# Patient Record
Sex: Female | Born: 2005
Health system: Southern US, Community
[De-identification: ages and names within clinical notes are randomized; demographics above are authoritative.]

## PROBLEM LIST (undated history)

## (undated) DIAGNOSIS — F419 Anxiety disorder, unspecified: Secondary | ICD-10-CM

## (undated) DIAGNOSIS — F32A Depression, unspecified: Secondary | ICD-10-CM

## (undated) DIAGNOSIS — T7840XA Allergy, unspecified, initial encounter: Secondary | ICD-10-CM

## (undated) DIAGNOSIS — N390 Urinary tract infection, site not specified: Secondary | ICD-10-CM

## (undated) DIAGNOSIS — F909 Attention-deficit hyperactivity disorder, unspecified type: Secondary | ICD-10-CM

## (undated) DIAGNOSIS — F509 Eating disorder, unspecified: Secondary | ICD-10-CM

---
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
## 2023-01-06 DIAGNOSIS — F411 Generalized anxiety disorder: Secondary | ICD-10-CM | POA: Diagnosis not present

## 2023-01-06 DIAGNOSIS — F640 Transsexualism: Secondary | ICD-10-CM | POA: Diagnosis not present

## 2023-01-20 DIAGNOSIS — F411 Generalized anxiety disorder: Secondary | ICD-10-CM | POA: Diagnosis not present

## 2023-01-20 DIAGNOSIS — R002 Palpitations: Secondary | ICD-10-CM | POA: Diagnosis not present

## 2023-01-20 DIAGNOSIS — R42 Dizziness and giddiness: Secondary | ICD-10-CM | POA: Diagnosis not present

## 2023-02-10 DIAGNOSIS — F900 Attention-deficit hyperactivity disorder, predominantly inattentive type: Secondary | ICD-10-CM | POA: Diagnosis not present

## 2023-02-10 DIAGNOSIS — F332 Major depressive disorder, recurrent severe without psychotic features: Secondary | ICD-10-CM | POA: Diagnosis not present

## 2023-02-10 DIAGNOSIS — F4310 Post-traumatic stress disorder, unspecified: Secondary | ICD-10-CM | POA: Diagnosis not present

## 2023-03-10 DIAGNOSIS — S60052A Contusion of left little finger without damage to nail, initial encounter: Secondary | ICD-10-CM | POA: Diagnosis not present

## 2023-03-28 DIAGNOSIS — F419 Anxiety disorder, unspecified: Secondary | ICD-10-CM | POA: Diagnosis not present

## 2023-03-28 DIAGNOSIS — R0789 Other chest pain: Secondary | ICD-10-CM | POA: Diagnosis not present

## 2023-03-28 DIAGNOSIS — R5383 Other fatigue: Secondary | ICD-10-CM | POA: Diagnosis not present

## 2023-03-28 DIAGNOSIS — Z1152 Encounter for screening for COVID-19: Secondary | ICD-10-CM | POA: Diagnosis not present

## 2023-03-28 DIAGNOSIS — I4510 Unspecified right bundle-branch block: Secondary | ICD-10-CM | POA: Diagnosis not present

## 2023-03-28 DIAGNOSIS — R55 Syncope and collapse: Secondary | ICD-10-CM | POA: Diagnosis not present

## 2023-03-28 DIAGNOSIS — R079 Chest pain, unspecified: Secondary | ICD-10-CM | POA: Diagnosis not present

## 2023-03-29 DIAGNOSIS — W19XXXA Unspecified fall, initial encounter: Secondary | ICD-10-CM | POA: Diagnosis not present

## 2023-03-29 DIAGNOSIS — R55 Syncope and collapse: Secondary | ICD-10-CM | POA: Diagnosis not present

## 2023-03-29 DIAGNOSIS — S0990XA Unspecified injury of head, initial encounter: Secondary | ICD-10-CM | POA: Diagnosis not present

## 2023-03-29 DIAGNOSIS — R110 Nausea: Secondary | ICD-10-CM | POA: Diagnosis not present

## 2023-03-29 DIAGNOSIS — R569 Unspecified convulsions: Secondary | ICD-10-CM | POA: Diagnosis not present

## 2023-03-29 DIAGNOSIS — Y92219 Unspecified school as the place of occurrence of the external cause: Secondary | ICD-10-CM | POA: Diagnosis not present

## 2023-03-30 DIAGNOSIS — R000 Tachycardia, unspecified: Secondary | ICD-10-CM | POA: Diagnosis not present

## 2023-03-30 DIAGNOSIS — R4589 Other symptoms and signs involving emotional state: Secondary | ICD-10-CM | POA: Diagnosis not present

## 2023-03-30 DIAGNOSIS — R55 Syncope and collapse: Secondary | ICD-10-CM | POA: Diagnosis not present

## 2023-03-30 DIAGNOSIS — I4519 Other right bundle-branch block: Secondary | ICD-10-CM | POA: Diagnosis not present

## 2023-04-01 DIAGNOSIS — F419 Anxiety disorder, unspecified: Secondary | ICD-10-CM | POA: Diagnosis not present

## 2023-04-01 DIAGNOSIS — F32A Depression, unspecified: Secondary | ICD-10-CM | POA: Diagnosis not present

## 2023-04-01 DIAGNOSIS — R000 Tachycardia, unspecified: Secondary | ICD-10-CM | POA: Diagnosis not present

## 2023-04-01 DIAGNOSIS — Z79899 Other long term (current) drug therapy: Secondary | ICD-10-CM | POA: Diagnosis not present

## 2023-04-01 DIAGNOSIS — R42 Dizziness and giddiness: Secondary | ICD-10-CM | POA: Diagnosis not present

## 2023-04-01 DIAGNOSIS — R0602 Shortness of breath: Secondary | ICD-10-CM | POA: Diagnosis not present

## 2023-04-01 DIAGNOSIS — Z79890 Hormone replacement therapy (postmenopausal): Secondary | ICD-10-CM | POA: Diagnosis not present

## 2023-04-01 DIAGNOSIS — F909 Attention-deficit hyperactivity disorder, unspecified type: Secondary | ICD-10-CM | POA: Diagnosis not present

## 2023-04-05 DIAGNOSIS — R55 Syncope and collapse: Secondary | ICD-10-CM | POA: Diagnosis not present

## 2023-04-05 DIAGNOSIS — R000 Tachycardia, unspecified: Secondary | ICD-10-CM | POA: Diagnosis not present

## 2023-04-07 DIAGNOSIS — F900 Attention-deficit hyperactivity disorder, predominantly inattentive type: Secondary | ICD-10-CM | POA: Diagnosis not present

## 2023-04-07 DIAGNOSIS — F4310 Post-traumatic stress disorder, unspecified: Secondary | ICD-10-CM | POA: Diagnosis not present

## 2023-04-07 DIAGNOSIS — F332 Major depressive disorder, recurrent severe without psychotic features: Secondary | ICD-10-CM | POA: Diagnosis not present

## 2023-04-19 DIAGNOSIS — R55 Syncope and collapse: Secondary | ICD-10-CM | POA: Diagnosis not present

## 2023-04-19 DIAGNOSIS — R000 Tachycardia, unspecified: Secondary | ICD-10-CM | POA: Diagnosis not present

## 2023-04-20 DIAGNOSIS — R000 Tachycardia, unspecified: Secondary | ICD-10-CM | POA: Diagnosis not present

## 2023-04-20 DIAGNOSIS — R55 Syncope and collapse: Secondary | ICD-10-CM | POA: Diagnosis not present

## 2023-05-04 DIAGNOSIS — F332 Major depressive disorder, recurrent severe without psychotic features: Secondary | ICD-10-CM | POA: Diagnosis not present

## 2023-05-04 DIAGNOSIS — F4310 Post-traumatic stress disorder, unspecified: Secondary | ICD-10-CM | POA: Diagnosis not present

## 2023-05-04 DIAGNOSIS — F900 Attention-deficit hyperactivity disorder, predominantly inattentive type: Secondary | ICD-10-CM | POA: Diagnosis not present

## 2023-05-25 DIAGNOSIS — F649 Gender identity disorder, unspecified: Secondary | ICD-10-CM | POA: Diagnosis not present

## 2023-05-27 DIAGNOSIS — D485 Neoplasm of uncertain behavior of skin: Secondary | ICD-10-CM | POA: Diagnosis not present

## 2023-05-27 DIAGNOSIS — D2271 Melanocytic nevi of right lower limb, including hip: Secondary | ICD-10-CM | POA: Diagnosis not present

## 2023-06-01 ENCOUNTER — Encounter: Payer: Self-pay | Admitting: Cardiovascular Disease

## 2023-06-01 ENCOUNTER — Telehealth: Payer: Self-pay | Admitting: Pediatrics

## 2023-06-01 ENCOUNTER — Ambulatory Visit: Payer: BC Managed Care – PPO | Attending: Cardiovascular Disease | Admitting: Cardiovascular Disease

## 2023-06-01 VITALS — BP 118/72 | HR 78 | Ht 66.0 in | Wt 128.6 lb

## 2023-06-01 DIAGNOSIS — R55 Syncope and collapse: Secondary | ICD-10-CM | POA: Insufficient documentation

## 2023-06-01 NOTE — Patient Instructions (Signed)
Medication Instructions:  Your physician recommends that you continue on your current medications as directed. Please refer to the Current Medication list given to you today.  *If you need a refill on your cardiac medications before your next appointment, please call your pharmacy*   Follow-Up: At [facility], you and your health needs are our priority.  As part of our continuing mission to provide you with exceptional heart care, we have created designated Provider Care Teams.  These Care Teams include your primary Cardiologist (physician) and Advanced Practice Providers (APPs -  Physician Assistants and Nurse Practitioners) who all work together to provide you with the care you need, when you need it.  We recommend signing up for the patient portal called "MyChart".  Sign up information is provided on this After Visit Summary.  MyChart is used to connect with patients for Virtual Visits (Telemedicine).  Patients are able to view lab/test results, encounter notes, upcoming appointments, etc.  Non-urgent messages can be sent to your provider as well.   To learn more about what you can do with MyChart, go to [email or web address].    Your next appointment:   We will see you on an as needed basis.  Provider:   Jonathan Berry, MD  

## 2023-06-01 NOTE — Assessment & Plan Note (Signed)
Madison [name] is see me with his dad Latrell Reitan as referral for evaluation of tachycardia and syncope.  He has seen a cardiologist in [facility], Dr. Virgilio Belling, was thorough workup including a 2D echo which was essentially normal with a Zio patch which showed no significant arrhythmias other than some atrial tachycardia.  He describes palpitations for last 6 or 8 years.  Since being in college he is going to the ER 7 times for lightheadedness, palpitations and syncope.  He also has ADHD and anxiety depression on SSRI and trazodone.  I am going to refer him to Dr. Berton Mount, electrophysiology for further evaluation.

## 2023-06-01 NOTE — Telephone Encounter (Signed)
Patient would like to establish care with Dr. Linford Arnold. The patients family members are current patients of Dr. Linford Arnold.

## 2023-06-01 NOTE — Progress Notes (Signed)
Madison [name]     06/01/2023 Madison [name]   2006/01/22  [id number]  Primary Physician Madison Peach, April, MD Primary Cardiologist: Madison Gess MD Madison [name], Madison [name]  HPI:  Madison [name] is a 17 y.o. thin-appearing transgender female who is accompanied by his father Madison [name], who is a patient of Madison [name]'s.  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  He does have a history of anxiety depression and ADHD as well as OCD.  He is on SSRI and trazodone and was on ADHD medications which have been stopped.  He describes palpitations for at least 6 to 8 years.  Since being at college he has visited the ER 7 times being evaluated for lightheadedness, tachypalpitations and syncope.  He did see a cardiologist in [facility], Madison [name], who did a 2D echo which was essentially normal and Zio patch which showed no significant arrhythmias.  He does have an incomplete right bundle branch block.   Current Meds  Medication Sig   FLUoxetine (PROZAC) 40 MG capsule Take 40 mg by mouth 2 (two) times daily.   Testosterone 12.5 MG/ACT (1%) GEL Place onto the skin.   traZODone (DESYREL) 50 MG tablet Take 25 mg by mouth at bedtime. Take 0.5 tablets 4 times daily     Allergies  Allergen Reactions   Cats Claw (Uncaria Tomentosa)     Sinus infection   Grass Pollen(K-O-R-T-Swt Vern)     unknown    Social History   Socioeconomic History   Marital status: Single    Spouse name: Not on file   Number of children: Not on file   Years of education: Not on file   Highest education level: Not on file  Occupational History   Occupation: Lobbyist: [employer]  Tobacco Use   Smoking status: Never   Smokeless tobacco: Never  Substance and Sexual Activity   Alcohol use: Yes    Comment: one time occurance    Drug use: Never   Sexual activity: Not Currently    Birth control/protection: None  Other Topics Concern    Not on file  Social History Narrative   Not on file   Social Determinants of Health   Financial Resource Strain: Not on file  Food Insecurity: Not on file  Transportation Needs: Not on file  Physical Activity: Not on file  Stress: Not on file  Social Connections: Not on file  Intimate Partner Violence: Not on file     Review of Systems: General: negative for chills, fever, night sweats or weight changes.  Cardiovascular: negative for chest pain, dyspnea on exertion, edema, orthopnea, palpitations, paroxysmal nocturnal dyspnea or shortness of breath Dermatological: negative for rash Respiratory: negative for cough or wheezing Urologic: negative for hematuria Abdominal: negative for nausea, vomiting, diarrhea, bright red blood per rectum, melena, or hematemesis Neurologic: negative for visual changes, syncope, or dizziness All other systems reviewed and are otherwise negative except as noted above.    Blood pressure 118/72, pulse 78, height 5\' 6"  (1.676 m), weight 128 lb 9.6 oz (58.3 kg), SpO2 98 %.  General appearance: alert and no distress Neck: no adenopathy, no carotid bruit, no JVD, supple, symmetrical, trachea midline, and thyroid not enlarged, symmetric, no tenderness/mass/nodules Lungs: clear to auscultation bilaterally Heart: regular rate and rhythm, S1, S2 normal, no murmur, click, rub or gallop Extremities: extremities normal, atraumatic, no cyanosis or edema Pulses: 2+ and  symmetric Skin: Skin color, texture, turgor normal. No rashes or lesions Neurologic: Grossly normal  EKG sinus rhythm at 78 with incomplete right bundle branch block.  I personally reviewed this EKG.  ASSESSMENT AND PLAN:   Neurocardiogenic syncope Madison [name] is see me with his dad Madison [name] as referral for evaluation of tachycardia and syncope.  He has seen a cardiologist in [facility], Madison [name], was thorough workup including a 2D echo which was essentially normal with a Zio  patch which showed no significant arrhythmias other than some atrial tachycardia.  He describes palpitations for last 6 or 8 years.  Since being in college he is going to the ER 7 times for lightheadedness, palpitations and syncope.  He also has ADHD and anxiety depression on SSRI and trazodone.  I am going to refer him to Dr. Berton [name], electrophysiology for further evaluation.     Madison Gess MD FACP,FACC,FAHA, [facility] 06/01/2023 2:21 PM

## 2023-06-07 NOTE — Telephone Encounter (Signed)
Yes ok to schedule.

## 2023-06-10 NOTE — Telephone Encounter (Signed)
Patient scheduled for 07/21/23

## 2023-06-21 ENCOUNTER — Encounter: Payer: Self-pay | Admitting: Internal Medicine

## 2023-06-21 ENCOUNTER — Ambulatory Visit: Payer: BC Managed Care – PPO | Attending: Internal Medicine | Admitting: Internal Medicine

## 2023-06-21 VITALS — BP 107/68 | HR 100 | Ht 66.0 in | Wt 129.8 lb

## 2023-06-21 DIAGNOSIS — R55 Syncope and collapse: Secondary | ICD-10-CM

## 2023-06-21 DIAGNOSIS — Q211 Atrial septal defect, unspecified: Secondary | ICD-10-CM | POA: Diagnosis not present

## 2023-06-21 NOTE — Patient Instructions (Addendum)
Medication Instructions:  Your physician recommends that you continue on your current medications as directed. Please refer to the Current Medication list given to you today.  *If you need a refill on your cardiac medications before your next appointment, please call your pharmacy*   Lab Work: None ordered.  If you have labs (blood work) drawn today and your tests are completely normal, you will receive your results only by: MyChart Message (if you have MyChart) OR A paper copy in the mail If you have any lab test that is abnormal or we need to change your treatment, we will call you to review the results.   Testing/Procedures: Echo with Bubble study - you will be called to schedule this appointment    Follow-Up: At [facility], you and your health needs are our priority.  As part of our continuing mission to provide you with exceptional heart care, we have created designated Provider Care Teams.  These Care Teams include your primary Cardiologist (physician) and Advanced Practice Providers (APPs -  Physician Assistants and Nurse Practitioners) who all work together to provide you with the care you need, when you need it.  We recommend signing up for the patient portal called "MyChart".  Sign up information is provided on this After Visit Summary.  MyChart is used to connect with patients for Virtual Visits (Telemedicine).  Patients are able to view lab/test results, encounter notes, upcoming appointments, etc.  Non-urgent messages can be sent to your provider as well.   To learn more about what you can do with MyChart, go to [email or web address].    Your next appointment:   4-6 weeks with Dr Graciela Husbands as scheduled  Other Instructions Recommended salt supplementation.  The goal is about 7-10 g of sodium, not sodium chloride, a day.  Salt supplements include oral ThermaTabs, buffered sodium preparation, SaltStick Vitassium,  Other options include NUUN.  This comes in pill form  and is dissolved in liquids.  Other liquid preparations include liquid IV, Pedialyte advance care, TRI-oral Also discussed the role of compression wear including thigh sleeves and abdominal binders.  Calf compression is not specifically recommended.Marland Kitchen

## 2023-06-21 NOTE — Progress Notes (Signed)
ELECTROPHYSIOLOGY CONSULT NOTE  Patient ID: Madison [name], MRN: [id number], DOB/AGE: Aug 22, 2006 17 y.o. Admit date: (Not on file) Date of Consult: 06/21/2023  Primary Physician: No primary care provider on file. Primary Cardiologist: YIXUAN PELAEZ is a 17 y.o. adult who is being seen today for the evaluation of POTS at the request of JB.    HPI HETAL ALTENHOFEN is a 17 y.o. adult "Madison [name]" who is referred for recurrent syncope.  Longstanding history of exercise associated palpitations and some lightheadedness, this got considerably worse in the fall concurrent with matriculating to school and then again worse in the spring.   Episodes are stereotypical with a prodrome of flushing diaphoresis nausea with recovery fatigue.  Prodromes can last 10-15 minutes and can be associated with syncope with residual orthostatic intolerance.  Heat intolerance.  Shower intolerance.  No apparent menses intolerance  Exercise is associated with tachypalpitations and dyspnea and some chest discomfort and lightheadedness  Madison [name] is a transgender with a past psychiatric history including depression, OCD and ADHD on SSRIs and trazodone.    Recurrent syncope with multiple hospital evaluations in [place]   Diet is salt deplete and fluid deplete  Does not use alcohol or marijuana  Echocardiogram was normal as outlined below 7-day Zio patch summary was reviewed heart rate average 89 multiple triggered episodes with heart rates up to 170 more specific details not available  DATE TEST EF   4/24 Echo   60-65 %         Date Cr K Hgb  4/24 0.74 4.0 13.7            Past Medical History:  Diagnosis Date   ADHD    Allergy    Anxiety    Depression    Eating disorder    Urinary tract infection       Surgical History: History reviewed. No pertinent surgical history.   Home Meds: Current Meds  Medication Sig   FLUoxetine (PROZAC) 40 MG capsule Take 40 mg by mouth 2 (two) times daily.    Testosterone 12.5 MG/ACT (1%) GEL Place 1 Pump onto the skin at bedtime.   traZODone (DESYREL) 50 MG tablet Take 25 mg by mouth 4 (four) times daily.    Allergies:  Allergies  Allergen Reactions   Cats Claw (Uncaria Tomentosa)     Sinus infection   Grass Pollen(K-O-R-T-Swt Vern)     unknown    Social History   Socioeconomic History   Marital status: Single    Spouse name: Not on file   Number of children: Not on file   Years of education: Not on file   Highest education level: Not on file  Occupational History   Occupation: Lobbyist: [employer]  Tobacco Use   Smoking status: Never   Smokeless tobacco: Never  Substance and Sexual Activity   Alcohol use: Yes    Comment: one time occurance    Drug use: Never   Sexual activity: Not Currently    Birth control/protection: None  Other Topics Concern   Not on file  Social History Narrative   Not on file   Social Determinants of Health   Financial Resource Strain: Not on file  Food Insecurity: Not on file  Transportation Needs: Not on file  Physical Activity: Not on file  Stress: Not on file  Social Connections: Not on file  Intimate Partner Violence: Not on file  Family History  Problem Relation Age of Onset   Anxiety disorder Mother    Depression Mother    Suicidality Mother    Bipolar disorder Mother    Anxiety disorder Sister    Depression Sister    ADD / ADHD Sister      ROS:  Please see the history of present illness.     All other systems reviewed and negative.    Physical Exam: Blood pressure 107/68, pulse 100, height 5\' 6"  (1.676 m), weight 129 lb 12.8 oz (58.9 kg). General: Well developed, well nourished adult in no acute distress. Head: Normocephalic, atraumatic, sclera non-icteric, no xanthomas, nares are without discharge. EENT: normal  Lymph Nodes:  none Neck: Negative for carotid bruits. JVD not elevated. Back:without scoliosis kyphosis Lungs: Clear bilaterally to auscultation  without wheezes, rales, or rhonchi. Breathing is unlabored. Heart: RRR with S1 S2. No murmur . No rubs, or gallops appreciated. Abdomen: Soft, non-tender, non-distended with normoactive bowel sounds. No hepatomegaly. No rebound/guarding. No obvious abdominal masses. Msk:  Strength and tone appear normal for age. Extremities: No clubbing or cyanosis. No  edema.  Distal pedal pulses are 2+ and equal bilaterally. Skin: Warm and Dry Neuro: Alert and oriented X 3. CN III-XII intact Grossly normal sensory and motor function . Psych:  Responds to questions appropriately with a normal affect.        EKG: Sinus at 73   14/10/38 RIGHTWARD AXIS    Assessment and Plan:  Dysautonomia with neurocardiogenic syncope and orthostatic tachycardia without criteria for POTS or IST  Anxiety/depression--OCD/ADHD  Incomplete right bundle/ right axis   Patient has symptoms consistent with dysautonomia including syncope and orthostatic tachycardia  We discussed extensively the issues of dysautonomia, the physiology of orthstasis and positional stress.  We discussed the role of salt and water repletion, the importance of exercise, often needing to be started in the recumbent position, and the awareness of triggers and the role of ambient heat and dehydration  Discussed salt substitutes with a  goal of 3-4 gms of Sodium or 12-15 gm of sodium Chloride daily.  Rehydration solutions include Liquid IV, NUUN, TriOral, Normralyte pedialyte advanced care and Banana Bags.  Salt tablets include plain salt tablets, SaltStick Vitassium, thermatabs amongst others.   The higher sodium concentration per serving on this list is normalyte about 800 mg of sodium per serving LMNT1000 mg and most recently of worried about Within You hydration formula also at 1000 mg  Salt supplements are best used with adjunctive sugar  Also discussed the role of compression wear including thigh sleeves and abdominal binders.  Calf compression  is not specifically recommended.  Have offered and Madison [name] has excepted a handicap sticker  Strongly encouraged attention to the psychosocial dynamics and the resultant stresses  With the ECG abnormalities, we will undertake an echo to with a bubble study to exclude an ASD     81 min     Sherryl Manges

## 2023-07-01 ENCOUNTER — Encounter (HOSPITAL_COMMUNITY): Payer: Self-pay | Admitting: Internal Medicine

## 2023-07-21 ENCOUNTER — Ambulatory Visit: Payer: BC Managed Care – PPO | Admitting: Family Medicine

## 2023-08-01 ENCOUNTER — Encounter: Payer: Self-pay | Admitting: Internal Medicine

## 2023-08-01 ENCOUNTER — Telehealth: Payer: Self-pay

## 2023-08-01 ENCOUNTER — Ambulatory Visit: Payer: BC Managed Care – PPO | Attending: Cardiovascular Disease | Admitting: Internal Medicine

## 2023-08-01 VITALS — Ht 66.0 in

## 2023-08-01 DIAGNOSIS — Q211 Atrial septal defect, unspecified: Secondary | ICD-10-CM

## 2023-08-01 DIAGNOSIS — R55 Syncope and collapse: Secondary | ICD-10-CM

## 2023-08-01 NOTE — Patient Instructions (Signed)
Medication Instructions:  Your physician recommends that you continue on your current medications as directed. Please refer to the Current Medication list given to you today.  *If you need a refill on your cardiac medications before your next appointment, please call your pharmacy*  Follow-Up: At [facility], you and your health needs are our priority.  As part of our continuing mission to provide you with exceptional heart care, we have created designated Provider Care Teams.  These Care Teams include your primary Cardiologist (physician) and Advanced Practice Providers (APPs -  Physician Assistants and Nurse Practitioners) who all work together to provide you with the care you need, when you need it.  Your next appointment:   4 months  Provider:   Sherryl Manges, MD

## 2023-08-01 NOTE — Telephone Encounter (Signed)
..   Pt understands that although there may be some limitations with this type of visit, we will take all precautions to reduce any security or privacy concerns.  Pt understands that this will be treated like an in office visit and we will file with pt's insurance, and there may be a patient responsible charge related to this service. ? ?

## 2023-08-01 NOTE — Progress Notes (Signed)
Electrophysiology TeleHealth Note      Date:  08/01/2023   ID:  Terrah, [place] [birth year], MRN [id number]  Location: patient's home  Provider location: [address] [place]  Evaluation Performed: Follow-up visit  PCP:  No primary care provider on file.  Cardiologist:   JB=e Electrophysiologist:  SK   Chief Complaint:  POTS  History of Present Illness:    Madison [name] is a 17 y.o. adult who presents via audio conferencing for a telehealth visit today.  Since last being seen in our clinic for orthostatic tachycardia and neurocardiogenic syncope at which time we reviewed the physiology and recommended salt water and compression the patient reports feeling a little better with liquid IV and sodium tablets. More energy--and more gradual onset of more severe symptoms.  Stress makes things flare.  Heat intolerance  Not exercising.      The patient denies symptoms of fevers, chills, cough, or new SOB worrisome for COVID 19.    Past Medical History:  Diagnosis Date   ADHD    Allergy    Anxiety    Depression    Eating disorder    Urinary tract infection     History reviewed. No pertinent surgical history.  Current Outpatient Medications  Medication Sig Dispense Refill   FLUoxetine (PROZAC) 40 MG capsule Take 40 mg by mouth 2 (two) times daily.     Testosterone 12.5 MG/ACT (1%) GEL Place 1 Pump onto the skin at bedtime.     traZODone (DESYREL) 50 MG tablet Take 25 mg by mouth 4 (four) times daily.     No current facility-administered medications for this visit.    Allergies:   Cats claw (uncaria tomentosa) and Grass pollen(k-o-r-t-swt vern)   ROS:  Please see the history of present illness.   All other systems are personally reviewed and negative.    Exam:    Vital Signs:  Ht 5\' 6"  (1.676 m)   LMP 07/18/2023 (Approximate)   BMI 20.95 kg/m       Labs/Other Tests and Data Reviewed:    Recent Labs: No results found for requested labs  within last 365 days.   Wt Readings from Last 3 Encounters:  06/21/23 129 lb 12.8 oz (58.9 kg) (54%, Z= 0.11)*  06/01/23 128 lb 9.6 oz (58.3 kg) (53%, Z= 0.06)*  12/05/20 116 lb 6.5 oz (52.8 kg) (40%, Z= -0.26)*   * Growth percentiles are based on CDC (Girls, 2-20 Years) data.     Other studies personally reviewed: Additional studies/ records that were reviewed today include:   Review of the above records today demonstrates:  Prior radiographs:     ASSESSMENT & PLAN:    Dysautonomia with neurocardiogenic syncope and orthostatic tachycardia without criteria for POTS or IST   Anxiety/depression--OCD/ADHD   Incomplete right bundle/ right axis    Continue salt and H2O Consider handicapped sticker from student health services Aerobic exercise  Continue to work on mental health issues   Follow-up:  13m     Current medicines are reviewed at length with the patient today.   The patient  concerns regarding  medicines.  The following changes were made today:    Labs/ tests ordered today include:  No orders of the defined types were placed in this encounter.     Today, I have spent 21  minutes with the patient with telehealth technology discussing the above.  Signed, Sherryl Manges, MD  08/01/2023 4:26 PM  [facility] [address] [place] [address] [phone] (office) [phone] (fax)

## 2023-08-02 DIAGNOSIS — F332 Major depressive disorder, recurrent severe without psychotic features: Secondary | ICD-10-CM | POA: Diagnosis not present

## 2023-08-02 DIAGNOSIS — F900 Attention-deficit hyperactivity disorder, predominantly inattentive type: Secondary | ICD-10-CM | POA: Diagnosis not present

## 2023-08-02 DIAGNOSIS — F4310 Post-traumatic stress disorder, unspecified: Secondary | ICD-10-CM | POA: Diagnosis not present

## 2023-08-11 ENCOUNTER — Ambulatory Visit (HOSPITAL_COMMUNITY): Payer: BC Managed Care – PPO | Attending: Internal Medicine

## 2023-08-11 DIAGNOSIS — Q211 Atrial septal defect, unspecified: Secondary | ICD-10-CM

## 2023-08-11 LAB — ECHOCARDIOGRAM COMPLETE BUBBLE STUDY
Area-P 1/2: 3.77 cm2
S' Lateral: 2.8 cm

## 2023-08-23 DIAGNOSIS — Z79899 Other long term (current) drug therapy: Secondary | ICD-10-CM | POA: Diagnosis not present

## 2023-08-23 DIAGNOSIS — F4310 Post-traumatic stress disorder, unspecified: Secondary | ICD-10-CM | POA: Diagnosis not present

## 2023-08-23 DIAGNOSIS — R55 Syncope and collapse: Secondary | ICD-10-CM | POA: Diagnosis not present

## 2023-08-23 DIAGNOSIS — F419 Anxiety disorder, unspecified: Secondary | ICD-10-CM | POA: Diagnosis not present

## 2023-08-23 DIAGNOSIS — F909 Attention-deficit hyperactivity disorder, unspecified type: Secondary | ICD-10-CM | POA: Diagnosis not present

## 2023-08-23 DIAGNOSIS — F32A Depression, unspecified: Secondary | ICD-10-CM | POA: Diagnosis not present

## 2023-08-23 DIAGNOSIS — R0789 Other chest pain: Secondary | ICD-10-CM | POA: Diagnosis not present

## 2023-09-05 ENCOUNTER — Encounter: Payer: Self-pay | Admitting: General Practice

## 2023-09-12 DIAGNOSIS — F649 Gender identity disorder, unspecified: Secondary | ICD-10-CM | POA: Diagnosis not present

## 2023-09-14 DIAGNOSIS — F900 Attention-deficit hyperactivity disorder, predominantly inattentive type: Secondary | ICD-10-CM | POA: Diagnosis not present

## 2023-09-14 DIAGNOSIS — F332 Major depressive disorder, recurrent severe without psychotic features: Secondary | ICD-10-CM | POA: Diagnosis not present

## 2023-09-14 DIAGNOSIS — F4310 Post-traumatic stress disorder, unspecified: Secondary | ICD-10-CM | POA: Diagnosis not present

## 2023-11-06 DIAGNOSIS — B354 Tinea corporis: Secondary | ICD-10-CM | POA: Diagnosis not present

## 2023-12-27 DIAGNOSIS — G901 Familial dysautonomia [Riley-Day]: Secondary | ICD-10-CM | POA: Insufficient documentation

## 2023-12-30 ENCOUNTER — Telehealth: Payer: Self-pay | Admitting: Internal Medicine

## 2023-12-30 ENCOUNTER — Ambulatory Visit: Payer: BC Managed Care – PPO | Attending: Internal Medicine | Admitting: Internal Medicine

## 2023-12-30 ENCOUNTER — Encounter: Payer: Self-pay | Admitting: Internal Medicine

## 2023-12-30 VITALS — BP 100/66 | HR 105 | Ht 66.01 in | Wt 137.4 lb

## 2023-12-30 DIAGNOSIS — G901 Familial dysautonomia [Riley-Day]: Secondary | ICD-10-CM

## 2023-12-30 NOTE — Telephone Encounter (Signed)
 New Message:       Patient saw Dr Graciela Husbands today. She would like for him to write a prescription fo the cane chair they had discussed today please.

## 2023-12-30 NOTE — Patient Instructions (Signed)
 Medication Instructions:  Your physician recommends that you continue on your current medications as directed. Please refer to the Current Medication list given to you today.  *If you need a refill on your cardiac medications before your next appointment, please call your pharmacy*   Lab Work: None ordered.  If you have labs (blood work) drawn today and your tests are completely normal, you will receive your results only by: MyChart Message (if you have MyChart) OR A paper copy in the mail If you have any lab test that is abnormal or we need to change your treatment, we will call you to review the results.   Testing/Procedures: None ordered.    Follow-Up: At [facility], you and your health needs are our priority.  As part of our continuing mission to provide you with exceptional heart care, we have created designated Provider Care Teams.  These Care Teams include your primary Cardiologist (physician) and Advanced Practice Providers (APPs -  Physician Assistants and Nurse Practitioners) who all work together to provide you with the care you need, when you need it.  We recommend signing up for the patient portal called MyChart.  Sign up information is provided on this After Visit Summary.  MyChart is used to connect with patients for Virtual Visits (Telemedicine).  Patients are able to view lab/test results, encounter notes, upcoming appointments, etc.  Non-urgent messages can be sent to your provider as well.   To learn more about what you can do with MyChart, go to [email or web address].    Your next appointment:   Telephone office visit 6 months with Dr Fernande  Other Instructions Recommended salt supplementation.  The goal is about 7-10 g of sodium, not sodium chloride , a day.  Salt supplements include oral ThermaTabs, buffered sodium preparation, SaltStick Vitassium,  Other options include NUUN.  This comes in pill form and is dissolved in liquids.  Other liquid  preparations include liquid IV, Pedialyte advance care, TRI-oral Also discussed the role of compression wear including thigh sleeves and abdominal binders.  Calf compression is not specifically recommended.Madison [name]

## 2023-12-30 NOTE — Progress Notes (Signed)
 Patient Care Team: Fernande Elspeth BROCKS, MD as PCP - Electrophysiology (Cardiology)   HPI  Madison [name] is a 18 y.o. adult  Seen in followup for  recurrent syncope in the context of exercise associated palpitations and lightheadedness, stereotypical consistent with neurally mediated syncope Madison [name] is a transgender girl-avoid with a past psychiatric history notable for depression OCD and ADHD.  No interval syncope; ongoing LH at least a problem once a week.  Mostly occurs in the morning.  Madison [name] showers in the morning. Salt intake is moderate, limited by cost and availability.  Will talk to the family.  Handicap sticker has been of benefit. Mental health has been okay.  Anxiety as planning to return to school  Having hot flashes  Past Medical History:  Diagnosis Date   ADHD    Allergy    Anxiety    Depression    Eating disorder    Urinary tract infection     History reviewed. No pertinent surgical history.  Current Meds  Medication Sig   FLUoxetine (PROZAC) 40 MG capsule Take 40 mg by mouth 2 (two) times daily.   Testosterone  12.5 MG/ACT (1%) GEL Place 1 Pump onto the skin in the morning.   traZODone (DESYREL) 50 MG tablet Take 25 mg by mouth 4 (four) times daily.    Allergies  Allergen Reactions   Cats Claw (Uncaria Tomentosa)     Sinus infection   Grass Pollen(K-O-R-T-Swt Vern)     unknown      Review of Systems negative except from HPI and PMH  Physical Exam BP 100/66 Comment: Standing @ 3 mins.  Pulse (!) 105   Ht 5' 6.01 (1.677 m)   Wt 137 lb 6.4 oz (62.3 kg)   LMP 12/22/2023   SpO2 99%   BMI 22.17 kg/m  Well developed and well nourished in no acute distress HENT normal E scleral and icterus clear Neck Supple JVP flat; carotids brisk and full Clear to ausculation Regular rate and rhythm, no murmurs gallops   No clubbing cyanosis  Edema Alert and oriented, grossly normal motor and sensory function Skin Warm and Dry  ECG sinus at  76 Intervals 15/10/38 Axis rate 97  CrCl cannot be calculated (Patient's most recent lab result is older than the maximum 21 days allowed.).   Assessment and  Plan Dysautonomia with neurocardiogenic syncope and orthostatic tachycardia without criteria for POTS or IST   Anxiety/depression--OCD/ADHD   Incomplete right bundle/ right axis    We discussed extensively the issues of dysautonomia, the physiology of orthstasis and positional stress.  We discussed the role of salt and water repletion, the importance of exercise, often needing to be started in the recumbent position, and the awareness of triggers and the role of ambient heat and dehydration   Recommended salt supplementation.  The goal is about 3-4 g of sodium, not sodium chloride , a day.  Salt supplements include oral ThermaTabs, buffered sodium preparation, SaltStick Vitassium,  Other options include NUUN.  This comes in pill form and is dissolved in liquids.  Other liquid preparations include liquid IV, Pedialyte advance care, TRI-oral Also discussed the role of compression wear including thigh sleeves and abdominal binders.  Calf compression is not specifically recommended..  Raise the head of bed 4-6 inches Discussed morning strategies to limit exacerbating factors.  Stressed the importance of exercise and gave Madison [name] CHOP/POTS    Hot falshes I suspect are from hormone therapy  Current medicines are reviewed at length with the patient today .  The patient does not  have concerns regarding medicines.

## 2024-01-10 DIAGNOSIS — F649 Gender identity disorder, unspecified: Secondary | ICD-10-CM | POA: Diagnosis not present

## 2024-01-10 DIAGNOSIS — Z206 Contact with and (suspected) exposure to human immunodeficiency virus [HIV]: Secondary | ICD-10-CM | POA: Diagnosis not present

## 2024-01-10 DIAGNOSIS — Z118 Encounter for screening for other infectious and parasitic diseases: Secondary | ICD-10-CM | POA: Diagnosis not present

## 2024-01-11 DIAGNOSIS — F649 Gender identity disorder, unspecified: Secondary | ICD-10-CM | POA: Diagnosis not present

## 2024-01-11 DIAGNOSIS — Z118 Encounter for screening for other infectious and parasitic diseases: Secondary | ICD-10-CM | POA: Diagnosis not present

## 2024-01-11 DIAGNOSIS — Z206 Contact with and (suspected) exposure to human immunodeficiency virus [HIV]: Secondary | ICD-10-CM | POA: Diagnosis not present

## 2024-01-16 NOTE — Telephone Encounter (Signed)
Please tell Max, I have never thought of her prescription for this.  On [employer] is 27 bucks.

## 2024-01-24 DIAGNOSIS — F649 Gender identity disorder, unspecified: Secondary | ICD-10-CM | POA: Diagnosis not present

## 2024-01-24 DIAGNOSIS — F909 Attention-deficit hyperactivity disorder, unspecified type: Secondary | ICD-10-CM | POA: Diagnosis not present

## 2024-01-24 DIAGNOSIS — F411 Generalized anxiety disorder: Secondary | ICD-10-CM | POA: Diagnosis not present

## 2024-01-24 DIAGNOSIS — F331 Major depressive disorder, recurrent, moderate: Secondary | ICD-10-CM | POA: Diagnosis not present

## 2024-02-06 DIAGNOSIS — F331 Major depressive disorder, recurrent, moderate: Secondary | ICD-10-CM | POA: Diagnosis not present

## 2024-02-06 DIAGNOSIS — F909 Attention-deficit hyperactivity disorder, unspecified type: Secondary | ICD-10-CM | POA: Diagnosis not present

## 2024-02-06 DIAGNOSIS — F411 Generalized anxiety disorder: Secondary | ICD-10-CM | POA: Diagnosis not present

## 2024-02-06 DIAGNOSIS — F649 Gender identity disorder, unspecified: Secondary | ICD-10-CM | POA: Diagnosis not present

## 2024-02-15 DIAGNOSIS — B354 Tinea corporis: Secondary | ICD-10-CM | POA: Diagnosis not present

## 2024-02-19 DIAGNOSIS — R0602 Shortness of breath: Secondary | ICD-10-CM | POA: Diagnosis not present

## 2024-02-19 DIAGNOSIS — R079 Chest pain, unspecified: Secondary | ICD-10-CM | POA: Diagnosis not present

## 2024-02-19 DIAGNOSIS — M79661 Pain in right lower leg: Secondary | ICD-10-CM | POA: Diagnosis not present

## 2024-02-19 DIAGNOSIS — M79604 Pain in right leg: Secondary | ICD-10-CM | POA: Diagnosis not present

## 2024-02-19 DIAGNOSIS — M79671 Pain in right foot: Secondary | ICD-10-CM | POA: Diagnosis not present

## 2024-04-03 DIAGNOSIS — R569 Unspecified convulsions: Secondary | ICD-10-CM | POA: Diagnosis not present

## 2024-04-10 DIAGNOSIS — F649 Gender identity disorder, unspecified: Secondary | ICD-10-CM | POA: Diagnosis not present

## 2024-04-11 DIAGNOSIS — R569 Unspecified convulsions: Secondary | ICD-10-CM | POA: Diagnosis not present

## 2024-04-12 DIAGNOSIS — R569 Unspecified convulsions: Secondary | ICD-10-CM | POA: Diagnosis not present

## 2024-04-13 ENCOUNTER — Encounter: Payer: Self-pay | Admitting: Internal Medicine

## 2024-04-16 DIAGNOSIS — R202 Paresthesia of skin: Secondary | ICD-10-CM | POA: Diagnosis not present

## 2024-04-16 DIAGNOSIS — G43009 Migraine without aura, not intractable, without status migrainosus: Secondary | ICD-10-CM | POA: Diagnosis not present

## 2024-04-16 DIAGNOSIS — R200 Anesthesia of skin: Secondary | ICD-10-CM | POA: Diagnosis not present

## 2024-04-24 DIAGNOSIS — R202 Paresthesia of skin: Secondary | ICD-10-CM | POA: Diagnosis not present

## 2024-04-24 DIAGNOSIS — R200 Anesthesia of skin: Secondary | ICD-10-CM | POA: Diagnosis not present

## 2024-04-26 DIAGNOSIS — F649 Gender identity disorder, unspecified: Secondary | ICD-10-CM | POA: Diagnosis not present

## 2024-04-26 DIAGNOSIS — Z206 Contact with and (suspected) exposure to human immunodeficiency virus [HIV]: Secondary | ICD-10-CM | POA: Diagnosis not present

## 2024-04-26 DIAGNOSIS — Z5181 Encounter for therapeutic drug level monitoring: Secondary | ICD-10-CM | POA: Diagnosis not present

## 2024-04-26 DIAGNOSIS — Z118 Encounter for screening for other infectious and parasitic diseases: Secondary | ICD-10-CM | POA: Diagnosis not present

## 2024-04-30 DIAGNOSIS — F649 Gender identity disorder, unspecified: Secondary | ICD-10-CM | POA: Diagnosis not present

## 2024-04-30 DIAGNOSIS — F331 Major depressive disorder, recurrent, moderate: Secondary | ICD-10-CM | POA: Diagnosis not present

## 2024-04-30 DIAGNOSIS — F909 Attention-deficit hyperactivity disorder, unspecified type: Secondary | ICD-10-CM | POA: Diagnosis not present

## 2024-04-30 DIAGNOSIS — F411 Generalized anxiety disorder: Secondary | ICD-10-CM | POA: Diagnosis not present

## 2024-05-10 DIAGNOSIS — G4733 Obstructive sleep apnea (adult) (pediatric): Secondary | ICD-10-CM | POA: Diagnosis not present

## 2024-05-10 DIAGNOSIS — R569 Unspecified convulsions: Secondary | ICD-10-CM | POA: Diagnosis not present

## 2024-05-23 DIAGNOSIS — Z1159 Encounter for screening for other viral diseases: Secondary | ICD-10-CM | POA: Diagnosis not present

## 2024-05-23 DIAGNOSIS — R52 Pain, unspecified: Secondary | ICD-10-CM | POA: Diagnosis not present

## 2024-05-23 DIAGNOSIS — E559 Vitamin D deficiency, unspecified: Secondary | ICD-10-CM | POA: Diagnosis not present

## 2024-05-23 DIAGNOSIS — R5383 Other fatigue: Secondary | ICD-10-CM | POA: Diagnosis not present

## 2024-05-28 DIAGNOSIS — E559 Vitamin D deficiency, unspecified: Secondary | ICD-10-CM | POA: Diagnosis not present

## 2024-05-28 DIAGNOSIS — R52 Pain, unspecified: Secondary | ICD-10-CM | POA: Diagnosis not present

## 2024-05-31 ENCOUNTER — Telehealth: Payer: Self-pay | Admitting: Internal Medicine

## 2024-05-31 NOTE — Telephone Encounter (Signed)
 Pt calling for a update in regards to some forms that were faxed over for the pt to have cosmetic surgery. Please advise

## 2024-05-31 NOTE — Telephone Encounter (Signed)
 Patient also sent MyChart message. Replied to patient via MyChart.

## 2024-06-01 ENCOUNTER — Telehealth: Admitting: Internal Medicine

## 2024-06-04 ENCOUNTER — Telehealth: Payer: Self-pay

## 2024-06-04 NOTE — Telephone Encounter (Signed)
   Name: Madison [name]  DOB: Jul 17, 2006  MRN: [id number]  Primary Cardiologist: None   Preoperative team, please contact this patient and set up a phone call appointment for further preoperative risk assessment. Please obtain consent and complete medication review. Thank you for your help.  I confirm that guidance regarding antiplatelet and oral anticoagulation therapy has been completed and, if necessary, noted below.  None requested.   I also confirmed the patient resides in the state of [place] . As per [facility] telemedicine laws, the patient must reside in the state in which the provider is licensed.   Morey Ar, NP 06/04/2024, 3:37 PM [facility]

## 2024-06-04 NOTE — Telephone Encounter (Signed)
 Left message for pt to call back to schedule tele preop appt. Pt prefers to go by the name Madison [name].

## 2024-06-04 NOTE — Telephone Encounter (Signed)
 WAITING ON CALL FROM SURGEONS OFFICE ABOUT IF THERE IS A SET DATE FOR THIS PROCEDURE.       Pre-operative Risk Assessment    Patient Name: Madison [name]  DOB: May 01, 2006 MRN: [id number]   Date of last office visit: 12/30/23 STEVEN KLEIN, MD Date of next office visit: NONE   Request for Surgical Clearance    Procedure:  MASTECTOMY  Date of Surgery:  Clearance TBD                                Surgeon:  Dionicio Fray, DO Surgeon's Group or Practice Name:  [facility] Phone number:  [phone] Fax number:  [phone]   Type of Clearance Requested:   - Medical    Type of Anesthesia:  General    Additional requests/questions:    SignedCollin [name]   06/04/2024, 9:15 AM

## 2024-06-06 NOTE — Telephone Encounter (Signed)
 I s/w the pt's father (DPR). He took # [phone] for Max to call us  back to schedule a tele preop appt.

## 2024-06-07 ENCOUNTER — Telehealth: Payer: Self-pay

## 2024-06-07 NOTE — Telephone Encounter (Signed)
 Patient has been scheduled for tele preop appt med rec and consent done     Patient Consent for Virtual Visit         LARIYA KINZIE has provided verbal consent on 06/07/2024 for a virtual visit (video or telephone).   CONSENT FOR VIRTUAL VISIT FOR:  Lance Pinch  By participating in this virtual visit I agree to the following:  I hereby voluntarily request, consent and authorize [facility] and its employed or contracted physicians, physician assistants, nurse practitioners or other licensed health care professionals (the Practitioner), to provide me with telemedicine health care services (the "Services) as deemed necessary by the treating Practitioner. I acknowledge and consent to receive the Services by the Practitioner via telemedicine. I understand that the telemedicine visit will involve communicating with the Practitioner through live audiovisual communication technology and the disclosure of certain medical information by electronic transmission. I acknowledge that I have been given the opportunity to request an in-person assessment or other available alternative prior to the telemedicine visit and am voluntarily participating in the telemedicine visit.  I understand that I have the right to withhold or withdraw my consent to the use of telemedicine in the course of my care at any time, without affecting my right to future care or treatment, and that the Practitioner or I may terminate the telemedicine visit at any time. I understand that I have the right to inspect all information obtained and/or recorded in the course of the telemedicine visit and may receive copies of available information for a reasonable fee.  I understand that some of the potential risks of receiving the Services via telemedicine include:  Delay or interruption in medical evaluation due to technological equipment failure or disruption; Information transmitted may not be sufficient (e.g. poor resolution of  images) to allow for appropriate medical decision making by the Practitioner; and/or  In rare instances, security protocols could fail, causing a breach of personal health information.  Furthermore, I acknowledge that it is my responsibility to provide information about my medical history, conditions and care that is complete and accurate to the best of my ability. I acknowledge that Practitioner's advice, recommendations, and/or decision may be based on factors not within their control, such as incomplete or inaccurate data provided by me or distortions of diagnostic images or specimens that may result from electronic transmissions. I understand that the practice of medicine is not an exact science and that Practitioner makes no warranties or guarantees regarding treatment outcomes. I acknowledge that a copy of this consent can be made available to me via my patient portal [facility] MyChart), or I can request a printed copy by calling the office of  [facility].    I understand that my insurance will be billed for this visit.   I have read or had this consent read to me. I understand the contents of this consent, which adequately explains the benefits and risks of the Services being provided via telemedicine.  I have been provided ample opportunity to ask questions regarding this consent and the Services and have had my questions answered to my satisfaction. I give my informed consent for the services to be provided through the use of telemedicine in my medical care

## 2024-06-07 NOTE — Telephone Encounter (Signed)
 Patient has been scheduled for tele preop appt

## 2024-06-08 DIAGNOSIS — R52 Pain, unspecified: Secondary | ICD-10-CM | POA: Diagnosis not present

## 2024-06-08 DIAGNOSIS — G901 Familial dysautonomia [Riley-Day]: Secondary | ICD-10-CM | POA: Diagnosis not present

## 2024-06-08 DIAGNOSIS — Z118 Encounter for screening for other infectious and parasitic diseases: Secondary | ICD-10-CM | POA: Diagnosis not present

## 2024-06-08 DIAGNOSIS — F649 Gender identity disorder, unspecified: Secondary | ICD-10-CM | POA: Diagnosis not present

## 2024-06-08 DIAGNOSIS — Z0000 Encounter for general adult medical examination without abnormal findings: Secondary | ICD-10-CM | POA: Diagnosis not present

## 2024-06-08 DIAGNOSIS — Z23 Encounter for immunization: Secondary | ICD-10-CM | POA: Diagnosis not present

## 2024-06-11 NOTE — Progress Notes (Unsigned)
 Virtual Visit via Telephone Note   Because of Madison [name] co-morbid illnesses, he is at least at moderate risk for complications without adequate follow up.  This format is felt to be most appropriate for this patient at this time.  Due to technical limitations with video connection (technology), today's appointment will be conducted as an audio only telehealth visit, and Madison [name] verbally agreed to proceed in this manner.   All issues noted in this document were discussed and addressed.  No physical exam could be performed with this format.  Evaluation Performed:  Preoperative cardiovascular risk assessment _____________   Date:  06/11/2024   Patient ID:  Madison [name], Madison [name] May 31, 2006, MRN [id number] Patient Location:  Home Provider location:   Office  Primary Care Provider:  No primary care provider on file. Primary Cardiologist:  None  Chief Complaint / Patient Profile   18 y.o. y/o adult with a h/o neurocardiogenic  syncope who is pending mastectomy and presents today for telephonic preoperative cardiovascular risk assessment.  History of Present Illness    Madison [name] is a 18 y.o. adult who presents via audio/video conferencing for a telehealth visit today.  Pt was last seen in cardiology clinic on 12/30/2023 by Dr. Fernande.  At that time Madison [name] was doing well .  The patient is now pending procedure as outlined above. Since his last visit, he remains stable from a cardiac standpoint.  Today he denies chest pain, shortness of breath, lower extremity edema, fatigue, palpitations, melena, hematuria, hemoptysis, diaphoresis, weakness, orthopnea, and PND.   Past Medical History    Past Medical History:  Diagnosis Date   ADHD    Allergy    Anxiety    Depression    Eating disorder    Urinary tract infection    No past surgical history on file.  Allergies  Allergies  Allergen Reactions   Cats Claw (Uncaria Tomentosa)     Sinus infection   Grass  Pollen(K-O-R-T-Swt Vern)     unknown    Home Medications    Prior to Admission medications   Medication Sig Start Date End Date Taking? Authorizing Provider  FLUoxetine (PROZAC) 40 MG capsule Take 40 mg by mouth 2 (two) times daily.    [provider]  Testosterone  12.5 MG/ACT (1%) GEL Place 1 Pump onto the skin in the morning.    [provider]  traZODone (DESYREL) 50 MG tablet Take 25 mg by mouth 4 (four) times daily.    [provider]    Physical Exam    Vital Signs:  Madison [name] does not have vital signs available for review today.  Given telephonic nature of communication, physical exam is limited. AAOx3. NAD. Normal affect.  Speech and respirations are unlabored.  Accessory Clinical Findings    None  Assessment & Plan    1.  Preoperative Cardiovascular Risk Assessment:Procedure:  MASTECTOMY   Date of Surgery:  Clearance TBD                                  Surgeon:  NATHANEL ROSIER, DO Surgeon's Group or Practice Name:  [facility] Phone number:  [phone] Fax number:  [phone]     Primary Cardiologist: Dr. Fernande  Chart reviewed as part of pre-operative protocol coverage. Given past medical history and time since last visit, based on ACC/AHA guidelines, Madison [name] would be at  acceptable risk for the planned procedure without further cardiovascular testing.   His RCRI is very low risk, 0.4% risk of major cardiac event.  He is able to complete greater than 4 METS of physical activity.  Patient was advised that if he develops new symptoms prior to surgery to contact our office to arrange a follow-up appointment.  He verbalized understanding.  I will route this recommendation to the requesting party via Epic fax function and remove from pre-op pool.       Time:   Today, I have spent 5 minutes with the patient with telehealth technology discussing medical history, symptoms, and management plan.  I spent 10 minutes  reviewing patient's past cardiac history and cardiac medications.    Madison CHRISTELLA Beauvais, NP  06/11/2024, 8:38 AM

## 2024-06-12 ENCOUNTER — Ambulatory Visit: Attending: Cardiovascular Disease

## 2024-06-12 DIAGNOSIS — Z01810 Encounter for preprocedural cardiovascular examination: Secondary | ICD-10-CM | POA: Diagnosis not present

## 2024-06-20 DIAGNOSIS — M7660 Achilles tendinitis, unspecified leg: Secondary | ICD-10-CM | POA: Diagnosis not present

## 2024-06-20 DIAGNOSIS — M79661 Pain in right lower leg: Secondary | ICD-10-CM | POA: Diagnosis not present

## 2024-06-20 DIAGNOSIS — M79673 Pain in unspecified foot: Secondary | ICD-10-CM | POA: Diagnosis not present

## 2024-06-20 DIAGNOSIS — M7918 Myalgia, other site: Secondary | ICD-10-CM | POA: Diagnosis not present

## 2024-06-26 DIAGNOSIS — F332 Major depressive disorder, recurrent severe without psychotic features: Secondary | ICD-10-CM | POA: Diagnosis not present

## 2024-06-26 DIAGNOSIS — F4481 Dissociative identity disorder: Secondary | ICD-10-CM | POA: Diagnosis not present

## 2024-06-26 DIAGNOSIS — F4312 Post-traumatic stress disorder, chronic: Secondary | ICD-10-CM | POA: Diagnosis not present

## 2024-06-26 DIAGNOSIS — F429 Obsessive-compulsive disorder, unspecified: Secondary | ICD-10-CM | POA: Diagnosis not present

## 2024-06-28 DIAGNOSIS — M79661 Pain in right lower leg: Secondary | ICD-10-CM | POA: Diagnosis not present

## 2024-06-28 DIAGNOSIS — M7660 Achilles tendinitis, unspecified leg: Secondary | ICD-10-CM | POA: Diagnosis not present

## 2024-06-28 DIAGNOSIS — M79673 Pain in unspecified foot: Secondary | ICD-10-CM | POA: Diagnosis not present

## 2024-06-28 DIAGNOSIS — M2012 Hallux valgus (acquired), left foot: Secondary | ICD-10-CM | POA: Diagnosis not present

## 2024-06-28 DIAGNOSIS — M2011 Hallux valgus (acquired), right foot: Secondary | ICD-10-CM | POA: Diagnosis not present

## 2024-06-28 DIAGNOSIS — M25572 Pain in left ankle and joints of left foot: Secondary | ICD-10-CM | POA: Diagnosis not present

## 2024-06-28 DIAGNOSIS — M79662 Pain in left lower leg: Secondary | ICD-10-CM | POA: Diagnosis not present

## 2024-07-03 DIAGNOSIS — F4481 Dissociative identity disorder: Secondary | ICD-10-CM | POA: Diagnosis not present

## 2024-07-03 DIAGNOSIS — F429 Obsessive-compulsive disorder, unspecified: Secondary | ICD-10-CM | POA: Diagnosis not present

## 2024-07-03 DIAGNOSIS — F332 Major depressive disorder, recurrent severe without psychotic features: Secondary | ICD-10-CM | POA: Diagnosis not present

## 2024-07-03 DIAGNOSIS — F4312 Post-traumatic stress disorder, chronic: Secondary | ICD-10-CM | POA: Diagnosis not present

## 2024-07-10 DIAGNOSIS — G901 Familial dysautonomia [Riley-Day]: Secondary | ICD-10-CM | POA: Diagnosis not present

## 2024-07-10 DIAGNOSIS — F4481 Dissociative identity disorder: Secondary | ICD-10-CM | POA: Diagnosis not present

## 2024-07-10 DIAGNOSIS — F429 Obsessive-compulsive disorder, unspecified: Secondary | ICD-10-CM | POA: Diagnosis not present

## 2024-07-10 DIAGNOSIS — F332 Major depressive disorder, recurrent severe without psychotic features: Secondary | ICD-10-CM | POA: Diagnosis not present

## 2024-07-10 DIAGNOSIS — F4312 Post-traumatic stress disorder, chronic: Secondary | ICD-10-CM | POA: Diagnosis not present

## 2024-07-10 DIAGNOSIS — R55 Syncope and collapse: Secondary | ICD-10-CM | POA: Diagnosis not present

## 2024-07-17 DIAGNOSIS — F4312 Post-traumatic stress disorder, chronic: Secondary | ICD-10-CM | POA: Diagnosis not present

## 2024-07-17 DIAGNOSIS — F429 Obsessive-compulsive disorder, unspecified: Secondary | ICD-10-CM | POA: Diagnosis not present

## 2024-07-17 DIAGNOSIS — F4481 Dissociative identity disorder: Secondary | ICD-10-CM | POA: Diagnosis not present

## 2024-07-17 DIAGNOSIS — F332 Major depressive disorder, recurrent severe without psychotic features: Secondary | ICD-10-CM | POA: Diagnosis not present

## 2024-07-30 DIAGNOSIS — F331 Major depressive disorder, recurrent, moderate: Secondary | ICD-10-CM | POA: Diagnosis not present

## 2024-07-30 DIAGNOSIS — F909 Attention-deficit hyperactivity disorder, unspecified type: Secondary | ICD-10-CM | POA: Diagnosis not present

## 2024-07-30 DIAGNOSIS — F411 Generalized anxiety disorder: Secondary | ICD-10-CM | POA: Diagnosis not present

## 2024-07-30 DIAGNOSIS — F649 Gender identity disorder, unspecified: Secondary | ICD-10-CM | POA: Diagnosis not present

## 2024-07-31 DIAGNOSIS — G4733 Obstructive sleep apnea (adult) (pediatric): Secondary | ICD-10-CM | POA: Diagnosis not present

## 2024-07-31 DIAGNOSIS — G43709 Chronic migraine without aura, not intractable, without status migrainosus: Secondary | ICD-10-CM | POA: Diagnosis not present

## 2024-07-31 DIAGNOSIS — R569 Unspecified convulsions: Secondary | ICD-10-CM | POA: Diagnosis not present

## 2024-08-02 DIAGNOSIS — R109 Unspecified abdominal pain: Secondary | ICD-10-CM | POA: Diagnosis not present

## 2024-08-02 DIAGNOSIS — R110 Nausea: Secondary | ICD-10-CM | POA: Diagnosis not present

## 2024-08-02 DIAGNOSIS — R197 Diarrhea, unspecified: Secondary | ICD-10-CM | POA: Diagnosis not present

## 2024-08-03 DIAGNOSIS — F4312 Post-traumatic stress disorder, chronic: Secondary | ICD-10-CM | POA: Diagnosis not present

## 2024-08-08 DIAGNOSIS — G4733 Obstructive sleep apnea (adult) (pediatric): Secondary | ICD-10-CM | POA: Diagnosis not present

## 2024-08-13 DIAGNOSIS — G8929 Other chronic pain: Secondary | ICD-10-CM | POA: Diagnosis not present

## 2024-08-13 DIAGNOSIS — M25572 Pain in left ankle and joints of left foot: Secondary | ICD-10-CM | POA: Diagnosis not present

## 2024-08-13 DIAGNOSIS — M25571 Pain in right ankle and joints of right foot: Secondary | ICD-10-CM | POA: Diagnosis not present

## 2024-08-13 DIAGNOSIS — M797 Fibromyalgia: Secondary | ICD-10-CM | POA: Diagnosis not present

## 2024-08-15 DIAGNOSIS — F4312 Post-traumatic stress disorder, chronic: Secondary | ICD-10-CM | POA: Diagnosis not present

## 2024-08-29 DIAGNOSIS — F4312 Post-traumatic stress disorder, chronic: Secondary | ICD-10-CM | POA: Diagnosis not present

## 2024-08-30 DIAGNOSIS — G43709 Chronic migraine without aura, not intractable, without status migrainosus: Secondary | ICD-10-CM | POA: Diagnosis not present

## 2024-08-30 DIAGNOSIS — R202 Paresthesia of skin: Secondary | ICD-10-CM | POA: Diagnosis not present

## 2024-08-30 DIAGNOSIS — R55 Syncope and collapse: Secondary | ICD-10-CM | POA: Diagnosis not present

## 2024-08-30 DIAGNOSIS — R200 Anesthesia of skin: Secondary | ICD-10-CM | POA: Diagnosis not present

## 2024-09-03 DIAGNOSIS — M7660 Achilles tendinitis, unspecified leg: Secondary | ICD-10-CM | POA: Diagnosis not present

## 2024-09-03 DIAGNOSIS — M79673 Pain in unspecified foot: Secondary | ICD-10-CM | POA: Diagnosis not present

## 2024-09-03 DIAGNOSIS — M79661 Pain in right lower leg: Secondary | ICD-10-CM | POA: Diagnosis not present

## 2024-09-03 DIAGNOSIS — M79671 Pain in right foot: Secondary | ICD-10-CM | POA: Diagnosis not present

## 2024-09-08 DIAGNOSIS — B349 Viral infection, unspecified: Secondary | ICD-10-CM | POA: Diagnosis not present

## 2024-09-10 DIAGNOSIS — M79671 Pain in right foot: Secondary | ICD-10-CM | POA: Diagnosis not present

## 2024-09-12 DIAGNOSIS — G4733 Obstructive sleep apnea (adult) (pediatric): Secondary | ICD-10-CM | POA: Diagnosis not present

## 2024-09-18 DIAGNOSIS — G4733 Obstructive sleep apnea (adult) (pediatric): Secondary | ICD-10-CM | POA: Diagnosis not present

## 2024-10-02 DIAGNOSIS — R0609 Other forms of dyspnea: Secondary | ICD-10-CM | POA: Diagnosis not present

## 2024-10-02 DIAGNOSIS — I4510 Unspecified right bundle-branch block: Secondary | ICD-10-CM | POA: Diagnosis not present

## 2024-10-02 DIAGNOSIS — R0789 Other chest pain: Secondary | ICD-10-CM | POA: Diagnosis not present

## 2024-10-02 DIAGNOSIS — R079 Chest pain, unspecified: Secondary | ICD-10-CM | POA: Diagnosis not present

## 2024-10-02 DIAGNOSIS — R0602 Shortness of breath: Secondary | ICD-10-CM | POA: Diagnosis not present

## 2024-10-02 DIAGNOSIS — R0600 Dyspnea, unspecified: Secondary | ICD-10-CM | POA: Diagnosis not present

## 2024-10-05 DIAGNOSIS — R42 Dizziness and giddiness: Secondary | ICD-10-CM | POA: Diagnosis not present

## 2024-10-05 DIAGNOSIS — R0602 Shortness of breath: Secondary | ICD-10-CM | POA: Diagnosis not present

## 2024-10-05 DIAGNOSIS — R079 Chest pain, unspecified: Secondary | ICD-10-CM | POA: Diagnosis not present

## 2024-10-05 DIAGNOSIS — R109 Unspecified abdominal pain: Secondary | ICD-10-CM | POA: Diagnosis not present

## 2024-10-05 DIAGNOSIS — G4733 Obstructive sleep apnea (adult) (pediatric): Secondary | ICD-10-CM | POA: Diagnosis not present

## 2024-10-05 DIAGNOSIS — R0600 Dyspnea, unspecified: Secondary | ICD-10-CM | POA: Diagnosis not present

## 2024-10-05 DIAGNOSIS — R0789 Other chest pain: Secondary | ICD-10-CM | POA: Diagnosis not present

## 2024-10-08 DIAGNOSIS — R0602 Shortness of breath: Secondary | ICD-10-CM | POA: Diagnosis not present

## 2024-10-09 DIAGNOSIS — R0602 Shortness of breath: Secondary | ICD-10-CM | POA: Diagnosis not present

## 2024-10-10 DIAGNOSIS — R202 Paresthesia of skin: Secondary | ICD-10-CM | POA: Diagnosis not present

## 2024-10-10 DIAGNOSIS — R0602 Shortness of breath: Secondary | ICD-10-CM | POA: Diagnosis not present

## 2024-10-10 DIAGNOSIS — R0789 Other chest pain: Secondary | ICD-10-CM | POA: Diagnosis not present

## 2024-10-10 DIAGNOSIS — R0609 Other forms of dyspnea: Secondary | ICD-10-CM | POA: Diagnosis not present

## 2024-10-10 DIAGNOSIS — G901 Familial dysautonomia [Riley-Day]: Secondary | ICD-10-CM | POA: Diagnosis not present

## 2024-10-10 DIAGNOSIS — G4733 Obstructive sleep apnea (adult) (pediatric): Secondary | ICD-10-CM | POA: Diagnosis not present

## 2024-10-10 DIAGNOSIS — R079 Chest pain, unspecified: Secondary | ICD-10-CM | POA: Diagnosis not present

## 2024-10-18 DIAGNOSIS — R0602 Shortness of breath: Secondary | ICD-10-CM | POA: Diagnosis not present

## 2024-10-18 DIAGNOSIS — R202 Paresthesia of skin: Secondary | ICD-10-CM | POA: Diagnosis not present

## 2024-10-18 DIAGNOSIS — R0789 Other chest pain: Secondary | ICD-10-CM | POA: Diagnosis not present

## 2024-10-18 DIAGNOSIS — R42 Dizziness and giddiness: Secondary | ICD-10-CM | POA: Diagnosis not present

## 2024-10-29 DIAGNOSIS — R0602 Shortness of breath: Secondary | ICD-10-CM | POA: Diagnosis not present

## 2024-10-29 DIAGNOSIS — R942 Abnormal results of pulmonary function studies: Secondary | ICD-10-CM | POA: Diagnosis not present

## 2024-10-30 DIAGNOSIS — J9809 Other diseases of bronchus, not elsewhere classified: Secondary | ICD-10-CM | POA: Diagnosis not present

## 2024-10-30 DIAGNOSIS — R258 Other abnormal involuntary movements: Secondary | ICD-10-CM | POA: Diagnosis not present

## 2024-10-30 DIAGNOSIS — R569 Unspecified convulsions: Secondary | ICD-10-CM | POA: Diagnosis not present

## 2024-10-30 DIAGNOSIS — R0602 Shortness of breath: Secondary | ICD-10-CM | POA: Diagnosis not present

## 2024-10-30 DIAGNOSIS — R252 Cramp and spasm: Secondary | ICD-10-CM | POA: Diagnosis not present

## 2024-10-30 DIAGNOSIS — R942 Abnormal results of pulmonary function studies: Secondary | ICD-10-CM | POA: Diagnosis not present

## 2024-11-02 DIAGNOSIS — R0602 Shortness of breath: Secondary | ICD-10-CM | POA: Diagnosis not present

## 2024-11-02 DIAGNOSIS — R942 Abnormal results of pulmonary function studies: Secondary | ICD-10-CM | POA: Diagnosis not present

## 2024-11-05 DIAGNOSIS — R942 Abnormal results of pulmonary function studies: Secondary | ICD-10-CM | POA: Diagnosis not present

## 2024-11-05 DIAGNOSIS — R55 Syncope and collapse: Secondary | ICD-10-CM | POA: Diagnosis not present

## 2024-11-05 DIAGNOSIS — G43709 Chronic migraine without aura, not intractable, without status migrainosus: Secondary | ICD-10-CM | POA: Diagnosis not present

## 2024-11-05 DIAGNOSIS — R200 Anesthesia of skin: Secondary | ICD-10-CM | POA: Diagnosis not present

## 2024-11-06 DIAGNOSIS — Z118 Encounter for screening for other infectious and parasitic diseases: Secondary | ICD-10-CM | POA: Diagnosis not present

## 2024-11-06 DIAGNOSIS — F649 Gender identity disorder, unspecified: Secondary | ICD-10-CM | POA: Diagnosis not present

## 2024-11-06 DIAGNOSIS — Z114 Encounter for screening for human immunodeficiency virus [HIV]: Secondary | ICD-10-CM | POA: Diagnosis not present

## 2024-11-12 DIAGNOSIS — R200 Anesthesia of skin: Secondary | ICD-10-CM | POA: Diagnosis not present

## 2024-11-12 DIAGNOSIS — R202 Paresthesia of skin: Secondary | ICD-10-CM | POA: Diagnosis not present

## 2024-11-12 DIAGNOSIS — R55 Syncope and collapse: Secondary | ICD-10-CM | POA: Diagnosis not present

## 2024-11-12 DIAGNOSIS — G43709 Chronic migraine without aura, not intractable, without status migrainosus: Secondary | ICD-10-CM | POA: Diagnosis not present

## 2024-11-12 DIAGNOSIS — R942 Abnormal results of pulmonary function studies: Secondary | ICD-10-CM | POA: Diagnosis not present

## 2024-11-20 DIAGNOSIS — R29898 Other symptoms and signs involving the musculoskeletal system: Secondary | ICD-10-CM | POA: Diagnosis not present

## 2024-11-20 DIAGNOSIS — G729 Myopathy, unspecified: Secondary | ICD-10-CM | POA: Diagnosis not present

## 2024-11-20 DIAGNOSIS — M545 Low back pain, unspecified: Secondary | ICD-10-CM | POA: Diagnosis not present

## 2024-11-20 DIAGNOSIS — R0789 Other chest pain: Secondary | ICD-10-CM | POA: Diagnosis not present

## 2024-12-04 DIAGNOSIS — G4733 Obstructive sleep apnea (adult) (pediatric): Secondary | ICD-10-CM | POA: Diagnosis not present

## 2024-12-04 DIAGNOSIS — G4719 Other hypersomnia: Secondary | ICD-10-CM | POA: Diagnosis not present
